# Patient Record
Sex: Male | Born: 1975 | Race: White | Hispanic: No
Health system: Southern US, Community
[De-identification: ages and names within clinical notes are randomized; demographics above are authoritative.]

---

## 2017-06-02 ENCOUNTER — Emergency Department (HOSPITAL_COMMUNITY)
Admission: EM | Admit: 2017-06-02 | Discharge: 2017-06-03 | Disposition: A | Discharge status: Home / Self Care | Payer: Self-pay | Attending: Physician Assistant | Admitting: Physician Assistant

## 2017-06-02 ENCOUNTER — Emergency Department (HOSPITAL_COMMUNITY): Payer: Self-pay

## 2017-06-02 DIAGNOSIS — R4182 Altered mental status, unspecified: Secondary | ICD-10-CM | POA: Insufficient documentation

## 2017-06-02 DIAGNOSIS — R0602 Shortness of breath: Secondary | ICD-10-CM | POA: Insufficient documentation

## 2017-06-02 DIAGNOSIS — F10929 Alcohol use, unspecified with intoxication, unspecified: Secondary | ICD-10-CM | POA: Insufficient documentation

## 2017-06-02 LAB — CBC WITH DIFFERENTIAL/PLATELET
BASOS ABS: 0 10*3/uL (ref 0.0–0.1)
Basophils Relative: 0 %
EOS PCT: 1 %
Eosinophils Absolute: 0.1 10*3/uL (ref 0.0–0.7)
HEMATOCRIT: 45.1 % (ref 39.0–52.0)
Hemoglobin: 16.5 g/dL (ref 13.0–17.0)
LYMPHS PCT: 20 %
Lymphs Abs: 2.5 10*3/uL (ref 0.7–4.0)
MCH: 32.9 pg (ref 26.0–34.0)
MCHC: 36.6 g/dL — AB (ref 30.0–36.0)
MCV: 90 fL (ref 78.0–100.0)
MONO ABS: 0.5 10*3/uL (ref 0.1–1.0)
MONOS PCT: 4 %
Neutro Abs: 9.4 10*3/uL — ABNORMAL HIGH (ref 1.7–7.7)
Neutrophils Relative %: 75 %
PLATELETS: 160 10*3/uL (ref 150–400)
RBC: 5.01 MIL/uL (ref 4.22–5.81)
RDW: 12.3 % (ref 11.5–15.5)
WBC: 12.5 10*3/uL — ABNORMAL HIGH (ref 4.0–10.5)

## 2017-06-02 LAB — BASIC METABOLIC PANEL
ANION GAP: 12 (ref 5–15)
BUN: 8 mg/dL (ref 6–20)
CALCIUM: 8 mg/dL — AB (ref 8.9–10.3)
CO2: 20 mmol/L — AB (ref 22–32)
CREATININE: 0.93 mg/dL (ref 0.61–1.24)
Chloride: 103 mmol/L (ref 101–111)
GFR calc Af Amer: >60 mL/min (ref >60)
GLUCOSE: 103 mg/dL — AB (ref 65–99)
Potassium: 4.6 mmol/L (ref 3.5–5.1)
Sodium: 135 mmol/L (ref 135–145)

## 2017-06-02 LAB — ETHANOL: ALCOHOL ETHYL (B): 293 mg/dL — AB (ref <5)

## 2017-06-02 MED ORDER — SODIUM CHLORIDE 0.9 % IV BOLUS (SEPSIS)
1000.0000 mL | Freq: Once | INTRAVENOUS | Status: AC
  Administered 2017-06-02: 1000 mL via INTRAVENOUS

## 2017-06-02 NOTE — ED Provider Notes (Signed)
[facility]-EMERGENCY DEPT Provider Note   CSN: [id number] Arrival date & time: 06/02/17  2118     History   Chief Complaint Chief Complaint  Patient presents with  . Alcohol Intoxication    HPI Jordan Wright is a 41 y.o. male.  HPI   Patient a 41 year old male presented with alcohol intoxication. Patient was brought in by EMS from a local hotel. According to friends patient was resting all day today and had half a gallon of fireball and 12 years.  Patient's friends were unable to give us much more history.   No past medical history on file.  There are no active problems to display for this patient.   No past surgical history on file.     Home Medications    Prior to Admission medications   Not on File    Family History No family history on file.  Social History Social History  Substance Use Topics  . Smoking status: Not on file  . Smokeless tobacco: Not on file  . Alcohol use Not on file     Allergies   Patient has no allergy information on record.   Review of Systems Review of Systems  Unable to perform ROS: Acuity of condition     Physical Exam Updated Vital Signs BP (!) 141/101   Pulse 90   Resp 18   SpO2 93%   Physical Exam  Constitutional: He appears well-nourished.  HENT:  Head: Normocephalic and atraumatic.  Eyes: Pupils are equal, round, and reactive to light. Conjunctivae are normal.  nystagmus  Cardiovascular: Normal rate and regular rhythm.   Pulmonary/Chest: Effort normal and breath sounds normal.  Musculoskeletal: He exhibits no edema.  Neurological:  Patient responded to painful stimuli  Currently appears to be protecting airway  Skin: Skin is warm and dry. He is not diaphoretic.     ED Treatments / Results  Labs (all labs ordered are listed, but only abnormal results are displayed) Labs Reviewed  ETHANOL - Abnormal; Notable for the following:       Result Value   Alcohol, Ethyl (B) 293 (*)    All other components  within normal limits  CBC WITH DIFFERENTIAL/PLATELET - Abnormal; Notable for the following:    WBC 12.5 (*)    MCHC 36.6 (*)    Neutro Abs 9.4 (*)    All other components within normal limits  BASIC METABOLIC PANEL - Abnormal; Notable for the following:    CO2 20 (*)    Glucose, Bld 103 (*)    Calcium 8.0 (*)    All other components within normal limits    EKG  EKG Interpretation None       Radiology Ct Head Wo Contrast  Result Date: 06/02/2017 CLINICAL DATA:  Altered level of consciousness. Alcohol use tonight. Minimally responsive. EXAM: CT HEAD WITHOUT CONTRAST TECHNIQUE: Contiguous axial images were obtained from the base of the skull through the vertex without intravenous contrast. COMPARISON:  None. FINDINGS: Brain: No evidence of acute infarction, hemorrhage, hydrocephalus, extra-axial collection or mass lesion/mass effect. Vascular: No hyperdense vessel or unexpected calcification. Skull: Normal. Negative for fracture or focal lesion. Sinuses/Orbits: No acute finding. Other: None. IMPRESSION: No acute intracranial abnormalities. Electronically Signed   By: Burman NievesWilliam  Stevens M.D.   On: 06/02/2017 22:47   Dg Chest Port 1 View  Result Date: 06/02/2017 CLINICAL DATA:  Alcohol intoxication.  Vomiting. EXAM: PORTABLE CHEST 1 VIEW COMPARISON:  None. FINDINGS: Poor inspiration. Borderline enlarged cardiac silhouette. Mild left basilar linear  atelectasis. Unremarkable bones. IMPRESSION: Poor inspiration with mild left basilar atelectasis. Electronically Signed   By: Beckie SaltsSteven  Reid M.D.   On: 06/02/2017 21:58    Procedures Procedures (including critical care time)  Medications Ordered in ED Medications  sodium chloride 0.9 % bolus 1,000 mL (1,000 mLs Intravenous New Bag/Given 06/02/17 2229)     Initial Impression / Assessment and Plan / ED Course  I have reviewed the triage vital signs and the nursing notes.  Pertinent labs & imaging results that were available during my care of the  patient were reviewed by me and considered in my medical decision making (see chart for details).    Patient is a 41 year old male presenting with intoxication. Patient protecting airway. Labs pending, x-ray done in case he aspirated.  Patient had slightly lower alcohol than anticipated given how intoxicated he appears.. Therefore we got a head CT.  Head CT negative.  Labs are reassuring. We will monitor for clinical sobriety.  Final Clinical Impressions(s) / ED Diagnoses   Final diagnoses:  SOB (shortness of breath)    New Prescriptions New Prescriptions   No medications on file     Abelino DerrickMackuen, Tamara Kenyon Lyn, MD 06/02/17 2348

## 2017-06-02 NOTE — ED Triage Notes (Signed)
Pt brought in via EMS from local hotel. Friends of pt reported that pt had drank 1/2 gallon of fireball and 1 case of beer. Pt minimally responsive to painful stimuli. Currently on 2L via nasal cannula.

## 2017-06-03 LAB — RAPID URINE DRUG SCREEN, HOSP PERFORMED
AMPHETAMINES: NOT DETECTED
BENZODIAZEPINES: NOT DETECTED
Barbiturates: NOT DETECTED
Cocaine: NOT DETECTED
OPIATES: NOT DETECTED
Tetrahydrocannabinol: NOT DETECTED

## 2017-06-03 LAB — URINALYSIS, ROUTINE W REFLEX MICROSCOPIC
Bilirubin Urine: NEGATIVE
GLUCOSE, UA: NEGATIVE mg/dL
Hgb urine dipstick: NEGATIVE
KETONES UR: NEGATIVE mg/dL
LEUKOCYTES UA: NEGATIVE
NITRITE: NEGATIVE
PROTEIN: NEGATIVE mg/dL
Specific Gravity, Urine: 1.008 (ref 1.005–1.030)
pH: 5 (ref 5.0–8.0)

## 2017-06-04 LAB — URINE CULTURE: CULTURE: NO GROWTH

## 2018-01-13 IMAGING — CT CT HEAD W/O CM
3 of 4 series · 15 of 47 positions shown, 18 images · non-contrast
Comparison: None.

CLINICAL DATA: Altered level of consciousness. Alcohol use tonight.
Minimally responsive.

EXAM:
CT HEAD WITHOUT CONTRAST
TECHNIQUE: Contiguous axial images were obtained from the base of the skull
through the vertex without intravenous contrast.

[Series 2: head w/o · axial · non-contrast · 0.44mm/px · z∈[+1742,+1872]mm · 9 of 32 slices shown, 12 images]
[im 3/32  brain]
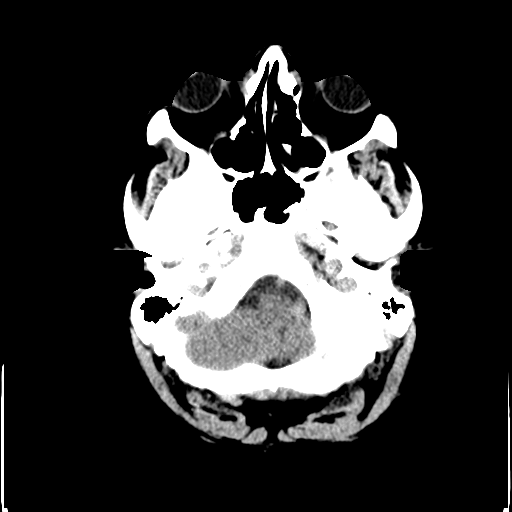
[im 3/32  bone]
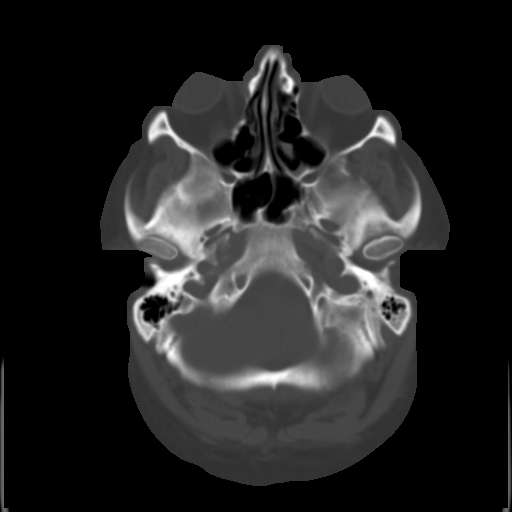
[im 7/32  brain]
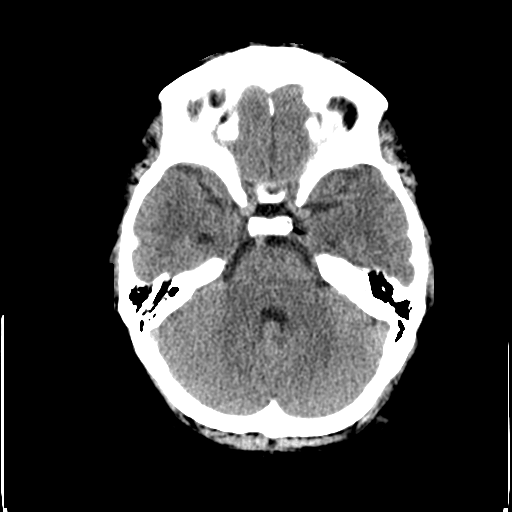
[im 9/32  brain]
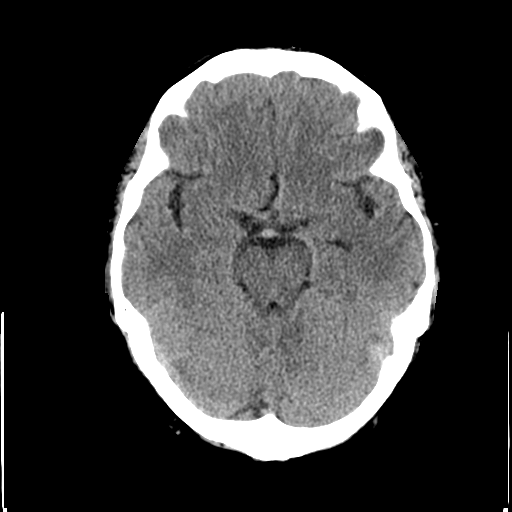
[im 14/32  brain]
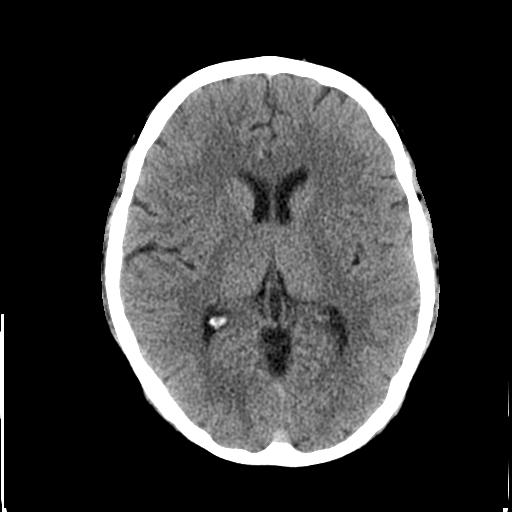
[im 16/32  brain]
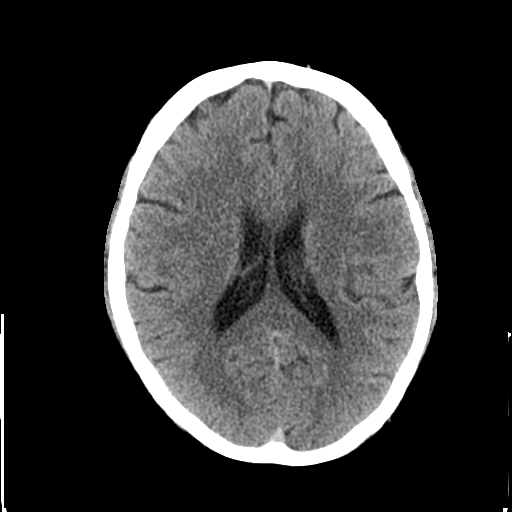
[im 16/32  bone]
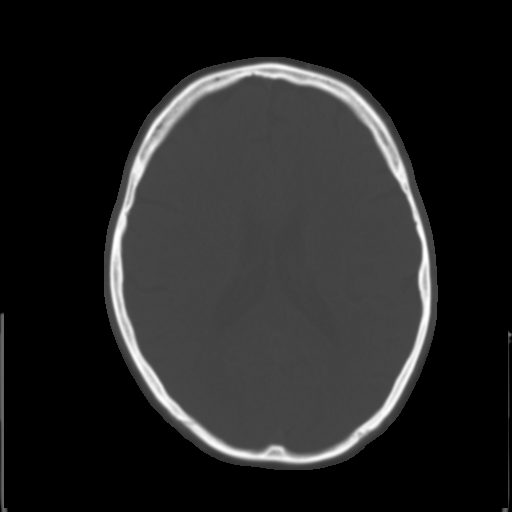
[im 18/32  brain]
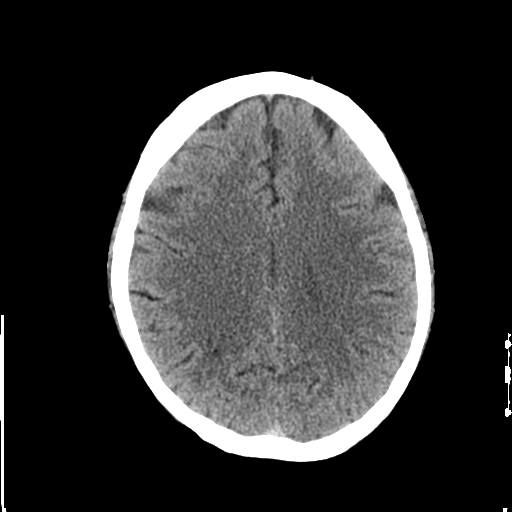
[im 23/32  brain]
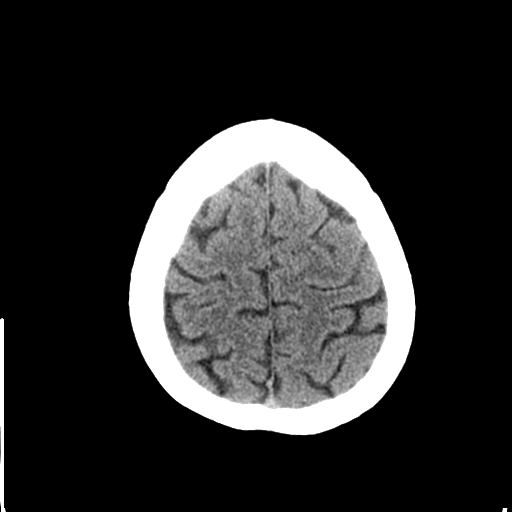
[im 25/32  brain]
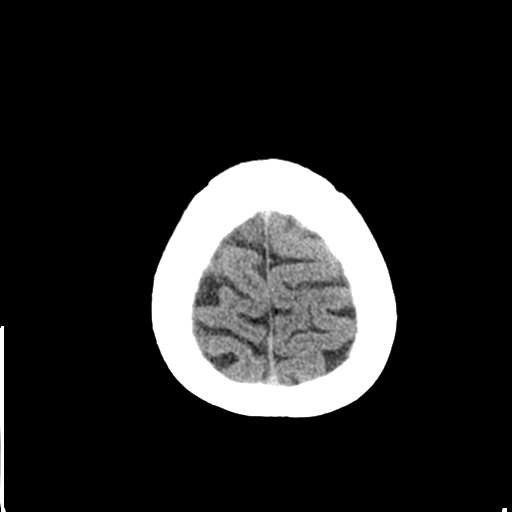
[im 29/32  brain]
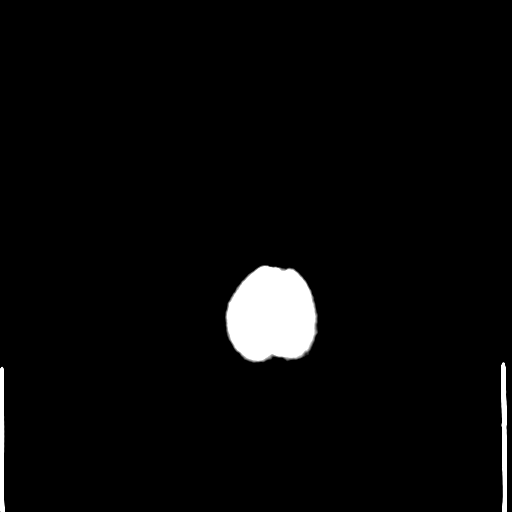
[im 29/32  bone]
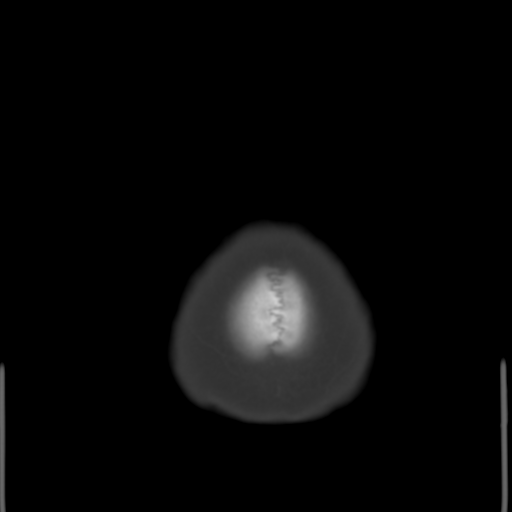

[Series 5: coronal · coronal · 0.31mm/px · 3 of 72 slices shown]
[im 24/72  brain]
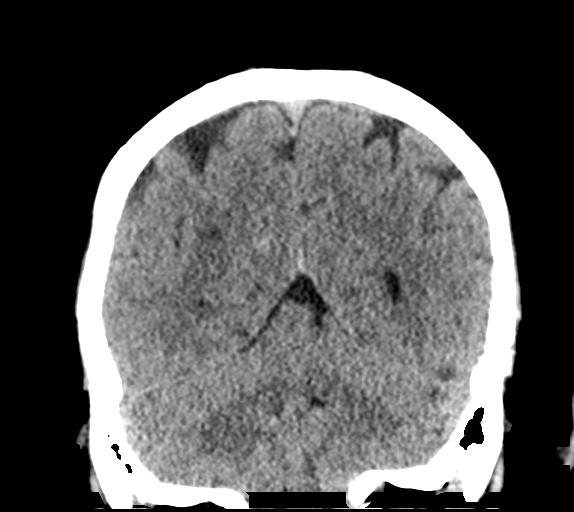
[im 32/72  brain]
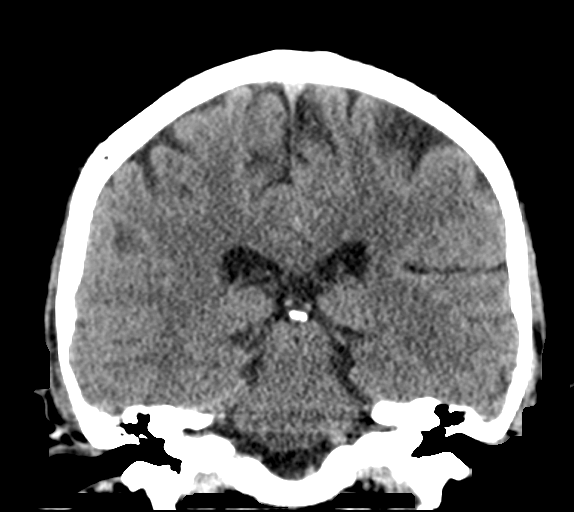
[im 40/72  brain]
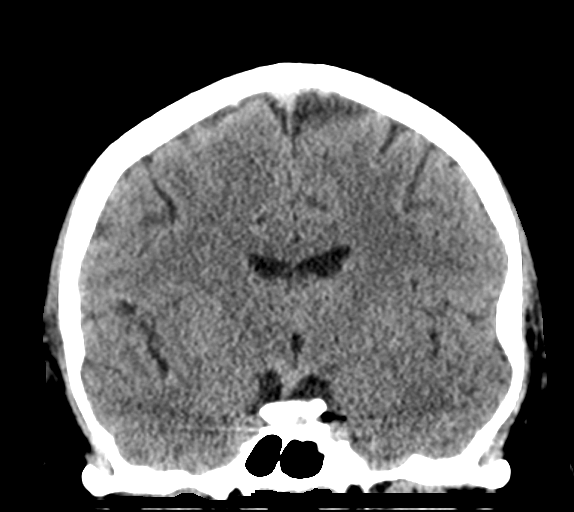

[Series 6: sagittal · sagittal · 0.29mm/px · 3 of 58 slices shown]
[im 20/58  brain]
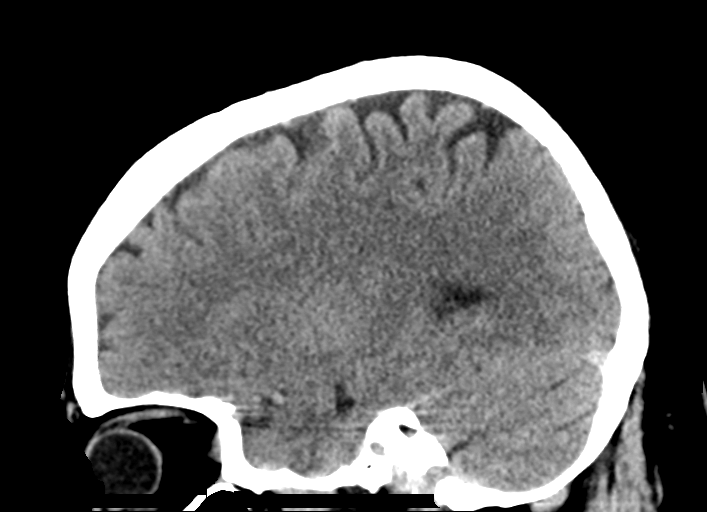
[im 29/58  brain]
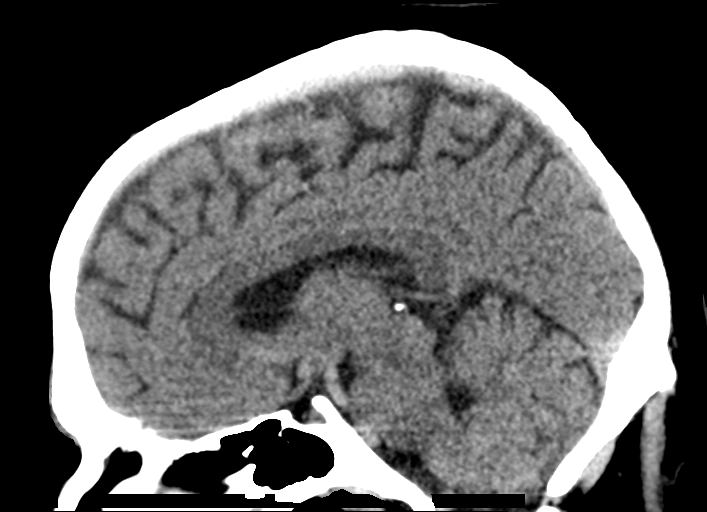
[im 39/58  brain]
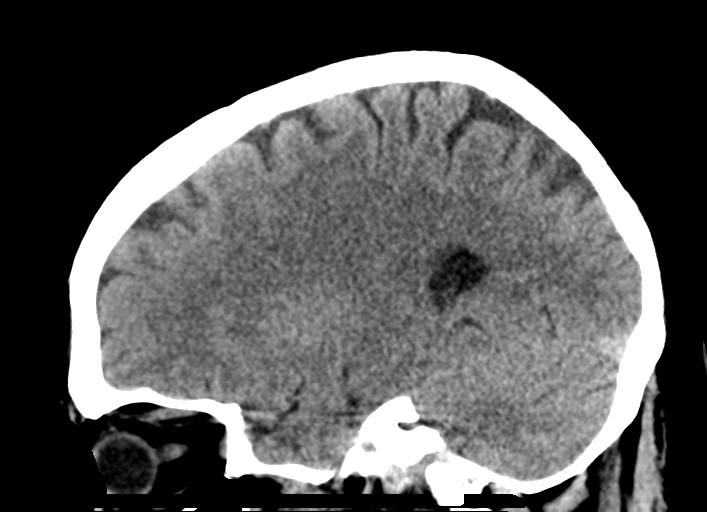

[15 of 47 positions shown; findings below may reference images not displayed]

FINDINGS: Brain: No evidence of acute infarction, hemorrhage, hydrocephalus,
extra-axial collection or mass lesion/mass effect.

Vascular: No hyperdense vessel or unexpected calcification.

Skull: Normal. Negative for fracture or focal lesion.

Sinuses/Orbits: No acute finding.

Other: None.
IMPRESSION: No acute intracranial abnormalities.
